# Patient Record
Sex: Female | Born: 2005 | Race: Black or African American | Hispanic: No | Marital: Single | Smoking: Never smoker
Health system: Southern US, Community
[De-identification: ages and names within clinical notes are randomized; demographics above are authoritative.]

## PROBLEM LIST (undated history)

## (undated) DIAGNOSIS — Z8709 Personal history of other diseases of the respiratory system: Secondary | ICD-10-CM

## (undated) HISTORY — DX: Personal history of other diseases of the respiratory system: Z87.09

---
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
## 2023-06-07 DIAGNOSIS — B349 Viral infection, unspecified: Secondary | ICD-10-CM | POA: Diagnosis not present

## 2023-10-11 DIAGNOSIS — Z3202 Encounter for pregnancy test, result negative: Secondary | ICD-10-CM | POA: Diagnosis not present

## 2023-10-11 DIAGNOSIS — Z3009 Encounter for other general counseling and advice on contraception: Secondary | ICD-10-CM | POA: Diagnosis not present

## 2023-10-11 DIAGNOSIS — Z30014 Encounter for initial prescription of intrauterine contraceptive device: Secondary | ICD-10-CM | POA: Diagnosis not present

## 2023-10-26 DIAGNOSIS — Z30430 Encounter for insertion of intrauterine contraceptive device: Secondary | ICD-10-CM | POA: Diagnosis not present

## 2023-10-26 DIAGNOSIS — Z113 Encounter for screening for infections with a predominantly sexual mode of transmission: Secondary | ICD-10-CM | POA: Diagnosis not present

## 2023-10-26 DIAGNOSIS — Z3202 Encounter for pregnancy test, result negative: Secondary | ICD-10-CM | POA: Diagnosis not present

## 2023-11-20 DIAGNOSIS — Z30431 Encounter for routine checking of intrauterine contraceptive device: Secondary | ICD-10-CM | POA: Diagnosis not present
